# Patient Record
Sex: Male | Born: 1995 | Race: Black or African American | Hispanic: No | Marital: Single | Smoking: Current every day smoker
Health system: Southern US, Community
[De-identification: ages and names within clinical notes are randomized; demographics above are authoritative.]

---
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
## 2014-01-25 ENCOUNTER — Encounter (HOSPITAL_COMMUNITY): Payer: Self-pay | Admitting: Emergency Medicine

## 2014-01-25 ENCOUNTER — Emergency Department (HOSPITAL_COMMUNITY)
Admission: EM | Admit: 2014-01-25 | Discharge: 2014-01-26 | Disposition: A | Discharge status: Home / Self Care | Payer: Medicaid Other | Attending: Emergency Medicine | Admitting: Emergency Medicine

## 2014-01-25 DIAGNOSIS — S61512A Laceration without foreign body of left wrist, initial encounter: Secondary | ICD-10-CM | POA: Insufficient documentation

## 2014-01-25 DIAGNOSIS — Y280XXA Contact with sharp glass, undetermined intent, initial encounter: Secondary | ICD-10-CM | POA: Insufficient documentation

## 2014-01-25 DIAGNOSIS — R000 Tachycardia, unspecified: Secondary | ICD-10-CM | POA: Insufficient documentation

## 2014-01-25 DIAGNOSIS — Y9389 Activity, other specified: Secondary | ICD-10-CM | POA: Diagnosis not present

## 2014-01-25 DIAGNOSIS — Y9289 Other specified places as the place of occurrence of the external cause: Secondary | ICD-10-CM | POA: Insufficient documentation

## 2014-01-25 DIAGNOSIS — Z23 Encounter for immunization: Secondary | ICD-10-CM | POA: Diagnosis not present

## 2014-01-25 MED ORDER — TETANUS-DIPHTH-ACELL PERTUSSIS 5-2.5-18.5 LF-MCG/0.5 IM SUSP
0.5000 mL | Freq: Once | INTRAMUSCULAR | Status: AC
  Administered 2014-01-25: 0.5 mL via INTRAMUSCULAR
  Filled 2014-01-25: qty 0.5

## 2014-01-25 MED ORDER — LIDOCAINE-EPINEPHRINE (PF) 2 %-1:200000 IJ SOLN
10.0000 mL | Freq: Once | INTRAMUSCULAR | Status: AC
  Administered 2014-01-25: 10 mL
  Filled 2014-01-25: qty 20

## 2014-01-25 MED ORDER — HYDROCODONE-ACETAMINOPHEN 5-325 MG PO TABS
1.0000 | ORAL_TABLET | Freq: Once | ORAL | Status: AC
  Administered 2014-01-25: 1 via ORAL
  Filled 2014-01-25: qty 1

## 2014-01-25 NOTE — ED Provider Notes (Signed)
Medical screening examination/treatment/procedure(s) were conducted as a shared visit with non-physician practitioner(s) and myself.  I personally evaluated the patient during the encounter.   EKG Interpretation None      Patient seen by me. Patient with a cut to the posterior aspect of the left wrist. Was cut on glass. Wound is approximately 2 cm in size somewhat elliptical. No evidence of any deep structures. No evidence of any retained foreign body. Will be suture repaired. Neurovascular intact distally.  Vanetta MuldersScott Allye Hoyos, MD 01/25/14 2351

## 2014-01-25 NOTE — ED Notes (Signed)
Cut to the top side of the left forearm

## 2014-01-25 NOTE — ED Provider Notes (Signed)
CSN: [id number]     Arrival date & time 01/25/14  2304 History   First MD Initiated Contact with Patient 01/25/14 2315     Chief Complaint  Patient presents with  . Laceration     (Consider location/radiation/quality/duration/timing/severity/associated sxs/prior Treatment) HPI Comments: Joseph BernheimJamil Strickland is a 18 y.o. male with no significant PMHx, who presents to the ED today with complaints of laceration to the L wrist/forearm over the dorsal aspect, which occurred on the edge of a piece of fiberglass 20mins ago. He reports he was reaching for something and the fiberglass cut him. Bleeding was controlled immediately. Pain is 6/10 constant aching nonradiating worse with movement of the skin, and without known alleviating factors as pt didn't try anything prior to arrival. Denies any numbness, tingling, weakness, loss of ROM, color changes, visible FBs, hand/wrist/elbow pain, known bleeding disorders or currently on any blood thinning medications. Mother is with him and reports that his tetanus is out of date. Denies any medical conditions or allergies. Pt is right handed. PCP [facility].  Patient is a 18 y.o. male presenting with skin laceration. The history is provided by the patient. No language interpreter was used.  Laceration Location:  Shoulder/arm Shoulder/arm laceration location:  L wrist Length (cm):  2 Depth:  Through dermis Quality: straight   Bleeding: controlled   Injury mechanism: fiberglass in car. Pain details:    Quality:  Aching   Severity:  Moderate (6/10)   Timing:  Constant   Progression:  Unchanged Foreign body present:  No foreign bodies Relieved by:  None tried Worsened by:  Movement Ineffective treatments:  None tried Tetanus status:  Out of date   History reviewed. No pertinent past medical history. History reviewed. No pertinent past surgical history. History reviewed. No pertinent family history. History  Substance Use Topics  . Smoking status:  Never Smoker   . Smokeless tobacco: Never Used  . Alcohol Use: No    Review of Systems  Musculoskeletal: Negative for arthralgias, joint swelling and myalgias.  Skin: Positive for wound. Negative for color change.  Allergic/Immunologic: Negative for immunocompromised state.  Neurological: Negative for dizziness, weakness, light-headedness and numbness.  Hematological: Does not bruise/bleed easily.   10 Systems reviewed and are negative for acute change except as noted in the HPI.    Allergies  Review of patient's allergies indicates no known allergies.  Home Medications   Prior to Admission medications   Not on File   BP 128/71  Pulse 116  Temp(Src) 98.9 F (37.2 C) (Oral)  Resp 20  Ht 5\' 8"  (1.727 m)  Wt 158 lb (71.668 kg)  BMI 24.03 kg/m2  SpO2 99% Physical Exam  Nursing note and vitals reviewed. Constitutional: He is oriented to person, place, and time. He appears well-developed and well-nourished.  Non-toxic appearance. No distress.  Afebrile, nontoxic, NAD, very pleasant  HENT:  Head: Normocephalic and atraumatic.  Mouth/Throat: Mucous membranes are normal.  Eyes: Conjunctivae and EOM are normal. Right eye exhibits no discharge. Left eye exhibits no discharge.  Neck: Normal range of motion. Neck supple.  Cardiovascular: Intact distal pulses.  Tachycardia present.   Tachycardia noted initially, which resolved Distal pulses intact, cap refill brisk and present in all digits  Pulmonary/Chest: Effort normal. No respiratory distress.  Abdominal: Normal appearance. He exhibits no distension.  Musculoskeletal: Normal range of motion.       Left wrist: He exhibits laceration. He exhibits normal range of motion, no tenderness, no swelling, no effusion and no crepitus.  Arms:      Left hand: Normal. Normal sensation noted. Normal strength noted.  FROM intact at L wrist and in all digits, no bony TTP or deformity, no crepitus. 2cm laceration located just proximal to  L wrist on the dorsal surface, wound edges separated by approx 0.5cm but approximate well, bleeding controlled. Superficial and no underlying tissues exposed or damaged. Flexor and extensor tendons intact and not visible through wound. Sensation grossly intact in all digits and extremities. Strength 5/5 in all extremities. Cap refill brisk and present, distal pulses equal, soft compartments  Neurological: He is alert and oriented to person, place, and time. He has normal strength. No sensory deficit.  Skin: Skin is warm and dry. Laceration noted. No rash noted.  L wrist laceration as noted above  Psychiatric: He has a normal mood and affect.      ED Course  LACERATION REPAIR Date/Time: 01/26/2014 11:47 PM Performed by: Marjean DonnaAMPRUBI-SOMS, Ardice Boyan STRUPP Authorized by: Ramond MarrowAMPRUBI-SOMS, Hanae Waiters STRUPP Consent: Verbal consent obtained. Risks and benefits: risks, benefits and alternatives were discussed Consent given by: patient and parent Patient understanding: patient states understanding of the procedure being performed Patient consent: the patient's understanding of the procedure matches consent given Patient identity confirmed: verbally with patient Body area: upper extremity Location details: left wrist Laceration length: 2 cm Foreign bodies: no foreign bodies Tendon involvement: none Nerve involvement: none Vascular damage: no Anesthesia: local infiltration Local anesthetic: lidocaine 2% with epinephrine Anesthetic total: 2 ml Patient sedated: no Preparation: Patient was prepped and draped in the usual sterile fashion. Irrigation solution: saline Irrigation method: syringe Amount of cleaning: extensive Debridement: none Degree of undermining: none Skin closure: 4-0 Prolene Number of sutures: 3 Technique: simple Approximation: close Approximation difficulty: simple Dressing: 4x4 sterile gauze, antibiotic ointment and splint Patient tolerance: Patient tolerated the procedure  well with no immediate complications.   (including critical care time) Labs Review Labs Reviewed - No data to display  Imaging Review No results found.   EKG Interpretation None      MDM   Final diagnoses:  Laceration of wrist, left, initial encounter  Tetanus toxoid vaccination administered at current visit    18y/o male with superficial lac to L forearm. Neurovascularly intact with FROM and strength preserved, no bony TTP. No visible FB, and wound occurred on a nonradioopaque object therefore doubt xray is needed or that it would help. Will update tetanus and give pain meds and repair laceration with sutures. Lac repaired with 3 sutures, well approximated. Will place in splint for protection. Applied bacitracin ointment and dressing. Doubt need for empiric antibiotics, pt is healthy with no comorbidities, wound was irrigated with copious amounts of saline. Will have him f/up in 2 days at PCP for wound recheck and eval need for abx at that time. Discussed sutured lac care instructions including ice/elevation, wound care, and pain meds. Rx for pain meds given. Discussed suture removal in 7-10 days. As for tachycardia, likely related to pain. Resolved during ED course. I explained the diagnosis and have given explicit precautions to return to the ER including for any other new or worsening symptoms. The patient understands and accepts the medical plan as it's been dictated and I have answered their questions. Discharge instructions concerning home care and prescriptions have been given. The patient is STABLE and is discharged to home in good condition.  BP 125/69  Pulse 100  Temp(Src) 98.5 F (36.9 C) (Oral)  Resp 18  Ht 5\' 8"  (1.727 m)  Wt 158  lb (71.668 kg)  BMI 24.03 kg/m2  SpO2 98%  Meds ordered this encounter  Medications  . Tdap (BOOSTRIX) injection 0.5 mL    Sig:   . HYDROcodone-acetaminophen (NORCO/VICODIN) 5-325 MG per tablet 1 tablet    Sig:   . lidocaine-EPINEPHrine  (XYLOCAINE W/EPI) 2 %-1:200000 (PF) injection 10 mL    Sig:   . naproxen (NAPROSYN) 500 MG tablet    Sig: Take 1 tablet (500 mg total) by mouth 2 (two) times daily as needed for mild pain, moderate pain or headache (TAKE WITH MEALS.).    Dispense:  20 tablet    Refill:  0    Order Specific Question:  Supervising Provider    Answer:  Eber Hong D [[id number]]  . HYDROcodone-acetaminophen (NORCO) 5-325 MG per tablet    Sig: Take 1-2 tablets by mouth every 6 (six) hours as needed for severe pain.    Dispense:  6 tablet    Refill:  0    Order Specific Question:  Supervising Provider    Answer:  Eber Hong D [[id number]]  . bacitracin ointment    Sig: Apply 1 application topically 2 (two) times daily.    Dispense:  120 g    Refill:  0    Order Specific Question:  Supervising Provider    Answer:  Vida Roller [address] Camprubi-Soms, PA-C 01/26/14 [phone]

## 2014-01-26 MED ORDER — NAPROXEN 500 MG PO TABS: 500.0000 mg | ORAL_TABLET | Freq: Two times a day (BID) | ORAL | Status: AC | PRN

## 2014-01-26 MED ORDER — BACITRACIN ZINC 500 UNIT/GM EX OINT: 1.0000 "application " | TOPICAL_OINTMENT | Freq: Two times a day (BID) | CUTANEOUS | Status: AC

## 2014-01-26 MED ORDER — HYDROCODONE-ACETAMINOPHEN 5-325 MG PO TABS: 1.0000 | ORAL_TABLET | Freq: Four times a day (QID) | ORAL | Status: AC | PRN

## 2014-01-26 NOTE — Discharge Instructions (Signed)
Keep wound and clean with mild soap and water. Keep area covered with a topical antibiotic ointment and bandage, keep bandage dry, and do not submerge in water for 24 hours. Ice and elevate for additional pain relief and swelling. Alternate between naprosyn and norco for additional pain relief but don't drive or operate machinery while taking norco. Use wrist splint to protect the stitches while they heal. [facility] your primary care doctor or [facility] in 2 days for wound recheck and in 7-10 days for suture removal. Monitor area for signs of infection to include, but not limited to: increasing pain, redness, drainage/pus, or swelling. Return to emergency department for emergent changing or worsening symptoms.   Laceration Care, Adult A laceration is a cut that goes through all layers of the skin. The cut goes into the tissue beneath the skin. HOME CARE For stitches (sutures) or staples:  Keep the cut clean and dry.  If you have a bandage (dressing), change it at least once a day. Change the bandage if it gets wet or dirty, or as told by your doctor.  Wash the cut with soap and water 2 times a day. Rinse the cut with water. Pat it dry with a clean towel.  Put a thin layer of medicated cream on the cut as told by your doctor.  You may shower after the first 24 hours. Do not soak the cut in water until the stitches are removed.  Only take medicines as told by your doctor.  Have your stitches or staples removed as told by your doctor. For skin adhesive strips:  Keep the cut clean and dry.  Do not get the strips wet. You may take a bath, but be careful to keep the cut dry.  If the cut gets wet, pat it dry with a clean towel.  The strips will fall off on their own. Do not remove the strips that are still stuck to the cut. For wound glue:  You may shower or take baths. Do not soak or scrub the cut. Do not swim. Avoid heavy sweating until the glue falls off on  its own. After a shower or bath, pat the cut dry with a clean towel.  Do not put medicine on your cut until the glue falls off.  If you have a bandage, do not put tape over the glue.  Avoid lots of sunlight or tanning lamps until the glue falls off. Put sunscreen on the cut for the first year to reduce your scar.  The glue will fall off on its own. Do not pick at the glue. You may need a tetanus shot if:  You cannot remember when you had your last tetanus shot.  You have never had a tetanus shot. If you need a tetanus shot and you choose not to have one, you may get tetanus. Sickness from tetanus can be serious. GET HELP RIGHT AWAY IF:   Your pain does not get better with medicine.  Your arm, hand, leg, or foot loses feeling (numbness) or changes color.  Your cut is bleeding.  Your joint feels weak, or you cannot use your joint.  You have painful lumps on your body.  Your cut is red, puffy (swollen), or painful.  You have a red line on the skin near the cut.  You have yellowish-white fluid (pus) coming from the cut.  You have a fever.  You have a bad smell coming from the cut or  bandage.  Your cut breaks open before or after stitches are removed.  You notice something coming out of the cut, such as wood or glass.  You cannot move a finger or toe. MAKE SURE YOU:   Understand these instructions.  Will watch your condition.  Will get help right away if you are not doing well or get worse. Document Released: 09/10/2007 Document Revised: 06/16/2011 Document Reviewed: 09/17/2010 [facility] Patient Information 2015 [place], [place]. This information is not intended to replace advice given to you by your health care provider. Make sure you discuss any questions you have with your health care provider.  Sutured Wound Care Sutures are stitches that can be used to close wounds. Caring for your wound can help stop infection and lessen pain. HOME CARE   Rest and raise  (elevate) the injured area until the pain and puffiness (swelling) go away.  Only take medicines as told by your doctor.  Clean the wound gently with mild soap and water once a day after the first 2 days. Rinse off the soap. Pat the area dry with a clean towel. Do not rub the wound.  Change the bandage (dressing) as told by your doctor. If the bandage sticks, soak it off with soapy water. Stop using a bandage after 2 days or after the wound stops leaking fluid.  Put cream on the wound as told by your doctor.  Do not stretch the wound.  Drink enough fluids to keep your pee (urine) clear or pale yellow.  See your doctor to have the sutures removed.  Use sunscreen or sunblock on the wound after it heals. GET HELP RIGHT AWAY IF:   Your wound gets red, puffy, hot, or tender.  You have more pain in the wound.  You have a red streak that goes away from the wound.  You see yellowish-white fluid (pus) coming out of the wound.  You have a fever.  You have chills and start to shake.  You notice a bad smell coming from the wound.  Your wound will not stop bleeding. MAKE SURE YOU:   Understand these instructions.  Will watch your condition.  Will get help right away if you are not doing well or get worse. Document Released: 09/10/2007 Document Revised: 06/16/2011 Document Reviewed: 07/28/2010 [facility] Patient Information 2015 [place], [place]. This information is not intended to replace advice given to you by your health care provider. Make sure you discuss any questions you have with your health care provider.

## 2021-01-04 ENCOUNTER — Ambulatory Visit (HOSPITAL_COMMUNITY)
Admission: EM | Admit: 2021-01-04 | Discharge: 2021-01-04 | Disposition: A | Discharge status: Home / Self Care | Payer: Self-pay | Attending: Emergency Medicine | Admitting: Emergency Medicine

## 2021-01-04 ENCOUNTER — Encounter (HOSPITAL_COMMUNITY): Payer: Self-pay | Admitting: Emergency Medicine

## 2021-01-04 ENCOUNTER — Other Ambulatory Visit: Payer: Self-pay

## 2021-01-04 DIAGNOSIS — Z202 Contact with and (suspected) exposure to infections with a predominantly sexual mode of transmission: Secondary | ICD-10-CM | POA: Insufficient documentation

## 2021-01-04 NOTE — ED Provider Notes (Signed)
[facility]    CSN: [id number] Arrival date & time: 01/04/21  9417      History   Chief Complaint Chief Complaint  Patient presents with   Exposure to STD    HPI Joseph Strickland is a 25 y.o. male.   Patient here for evaluation after possible exposure to STD.  Reports one of his partners told him she was evaluated here several days ago but did not tell him if she was actually positive for anything.  Denies any penile discharge or pain.  Denies any rashes.  Denies any dysuria, urgency, or frequency.  Denies any history of STDs.  Denies any trauma, injury, or other precipitating event.  Denies any specific alleviating or aggravating factors.  Denies any fevers, chest pain, shortness of breath, N/V/D, numbness, tingling, weakness, abdominal pain, or headaches.    The history is provided by the patient.  Exposure to STD   No past medical history on file.  There are no problems to display for this patient.   History reviewed. No pertinent surgical history.     Home Medications    Prior to Admission medications   Medication Sig Start Date End Date Taking? Authorizing Provider  bacitracin ointment Apply 1 application topically 2 (two) times daily. 01/26/14   Street, [place], PA-C  HYDROcodone-acetaminophen (NORCO) 5-325 MG per tablet Take 1-2 tablets by mouth every 6 (six) hours as needed for severe pain. 01/26/14   Street, [place], PA-C  naproxen (NAPROSYN) 500 MG tablet Take 1 tablet (500 mg total) by mouth 2 (two) times daily as needed for mild pain, moderate pain or headache (TAKE WITH MEALS.). 01/26/14   Street, [place], PA-C    Family History No family history on file.  Social History Social History   Tobacco Use   Smoking status: Never   Smokeless tobacco: Never  Substance Use Topics   Alcohol use: No   Drug use: Yes     Allergies   Patient has no known allergies.   Review of Systems Review of Systems  Genitourinary:  Negative for dysuria,  frequency, hematuria, penile pain and urgency.  All other systems reviewed and are negative.   Physical Exam Triage Vital Signs ED Triage Vitals  Enc Vitals Group     BP 01/04/21 0850 123/81     Pulse Rate 01/04/21 0850 73     Resp 01/04/21 0850 15     Temp 01/04/21 0850 99 F (37.2 C)     Temp Source 01/04/21 0850 Oral     SpO2 01/04/21 0850 99 %     Weight --      Height --      Head Circumference --      Peak Flow --      Pain Score 01/04/21 0849 0     Pain Loc --      Pain Edu? --      Excl. in GC? --    No data found.  Updated Vital Signs BP 123/81 (BP Location: Left Arm)   Pulse 73   Temp 99 F (37.2 C) (Oral)   Resp 15   SpO2 99%   Visual Acuity Right Eye Distance:   Left Eye Distance:   Bilateral Distance:    Right Eye Near:   Left Eye Near:    Bilateral Near:     Physical Exam Vitals and nursing note reviewed.  Constitutional:      General: He is not in acute distress.    Appearance: Normal  appearance. He is not ill-appearing, toxic-appearing or diaphoretic.  HENT:     Head: Normocephalic and atraumatic.  Eyes:     Conjunctiva/sclera: Conjunctivae normal.  Cardiovascular:     Rate and Rhythm: Normal rate.     Pulses: Normal pulses.  Pulmonary:     Effort: Pulmonary effort is normal.  Abdominal:     General: Abdomen is flat.  Genitourinary:    Comments: declines Musculoskeletal:        General: Normal range of motion.     Cervical back: Normal range of motion.  Skin:    General: Skin is warm and dry.  Neurological:     General: No focal deficit present.     Mental Status: He is alert and oriented to person, place, and time.  Psychiatric:        Mood and Affect: Mood normal.     UC Treatments / Results  Labs (all labs ordered are listed, but only abnormal results are displayed) Labs Reviewed  CYTOLOGY, (ORAL, ANAL, URETHRAL) ANCILLARY ONLY    EKG   Radiology No results found.  Procedures Procedures (including critical care  time)  Medications Ordered in UC Medications - No data to display  Initial Impression / Assessment and Plan / UC Course  I have reviewed the triage vital signs and the nursing notes.  Pertinent labs & imaging results that were available during my care of the patient were reviewed by me and considered in my medical decision making (see chart for details).    Assessment negative for red flags or concerns.  Self swab obtained and will treat based on results.  Discussed safe sex practices including condoms or other barrier method use.  Instructed to abstain from sex while waiting on test results.  Follow-up as needed Final Clinical Impressions(s) / UC Diagnoses   Final diagnoses:  Possible exposure to STD     Discharge Instructions      We will contact you if the results from your lab work are positive and require additional treatment.    Do not have sex while taking undergoing treatment for STI.  Make sure that all of your partners get tested and treated.   Use a condom or other barrier method for all sexual encounters.    Return or go to the Emergency Department if symptoms worsen or do not improve in the next few days.      ED Prescriptions   None    PDMP not reviewed this encounter.   Ivette Loyal, NP 01/04/21 [phone]

## 2021-01-04 NOTE — ED Triage Notes (Signed)
Pt reports that some one told him that they needed to get tested for STDs. Asked if it was a partner and if they had tested positive for something particular. Pt unsure. Denies any discharge, drainage, rashes, or issues with urination.

## 2021-01-04 NOTE — Discharge Instructions (Signed)
We will contact you if the results from your lab work are positive and require additional treatment.    Do not have sex while taking undergoing treatment for STI.  Make sure that all of your partners get tested and treated.   Use a condom or other barrier method for all sexual encounters.    Return or go to the Emergency Department if symptoms worsen or do not improve in the next few days.  

## 2021-01-07 ENCOUNTER — Telehealth (HOSPITAL_COMMUNITY): Payer: Self-pay | Admitting: Emergency Medicine

## 2021-01-07 LAB — CYTOLOGY, (ORAL, ANAL, URETHRAL) ANCILLARY ONLY
Chlamydia: NEGATIVE
Comment: NEGATIVE
Comment: NEGATIVE
Comment: NORMAL
Neisseria Gonorrhea: POSITIVE — AB
Trichomonas: POSITIVE — AB

## 2021-01-07 MED ORDER — METRONIDAZOLE 500 MG PO TABS: 2000.0000 mg | ORAL_TABLET | Freq: Once | ORAL | 0 refills | Status: AC

## 2021-01-07 NOTE — Telephone Encounter (Signed)
Per protocol, patient will need treatment with IM Rocephin 500mg  for positive Gonorrhea, and Metronidazole for positive Trichomonas.   Contacted patient by phone.  Verified identity using two identifiers.  Provided positive result.  Reviewed safe sex practices, notifying partners, and refraining from sexual activities for 7 days from time of treatment.  Patient verified understanding, all questions answered.   Verified pharmacy, prescription sent [facility] notified

## 2021-01-08 ENCOUNTER — Ambulatory Visit (HOSPITAL_COMMUNITY)
Admission: RE | Admit: 2021-01-08 | Discharge: 2021-01-08 | Disposition: A | Discharge status: Home / Self Care | Payer: Medicaid Other | Source: Ambulatory Visit | Attending: Family Medicine | Admitting: Family Medicine

## 2021-01-08 ENCOUNTER — Other Ambulatory Visit: Payer: Self-pay

## 2021-01-08 DIAGNOSIS — A549 Gonococcal infection, unspecified: Secondary | ICD-10-CM

## 2021-01-08 MED ORDER — CEFTRIAXONE SODIUM 500 MG IJ SOLR
INTRAMUSCULAR | Status: AC
  Filled 2021-01-08: qty 500

## 2021-01-08 MED ORDER — CEFTRIAXONE SODIUM 500 MG IJ SOLR
500.0000 mg | Freq: Once | INTRAMUSCULAR | Status: AC
  Administered 2021-01-08: 500 mg via INTRAMUSCULAR

## 2021-01-08 NOTE — ED Triage Notes (Signed)
Pt presents for STD treatment. Denies concerns.

## 2021-02-26 ENCOUNTER — Other Ambulatory Visit: Payer: Self-pay

## 2021-02-26 ENCOUNTER — Ambulatory Visit: Payer: Medicaid Other | Attending: Family Medicine | Admitting: Family Medicine

## 2021-02-26 ENCOUNTER — Encounter: Payer: Self-pay | Admitting: Family Medicine

## 2021-02-26 VITALS — BP 124/80 | HR 74 | Ht 68.0 in | Wt 198.4 lb

## 2021-02-26 DIAGNOSIS — B351 Tinea unguium: Secondary | ICD-10-CM

## 2021-02-26 DIAGNOSIS — B353 Tinea pedis: Secondary | ICD-10-CM

## 2021-02-26 DIAGNOSIS — Z1159 Encounter for screening for other viral diseases: Secondary | ICD-10-CM

## 2021-02-26 MED ORDER — TERBINAFINE HCL 1 % EX CREA: 1.0000 "application " | TOPICAL_CREAM | Freq: Two times a day (BID) | CUTANEOUS | 1 refills | Status: AC

## 2021-02-26 NOTE — Patient Instructions (Signed)

## 2021-02-26 NOTE — Progress Notes (Signed)
Subjective:  Patient ID: Joseph Strickland, male    DOB: 11-04-1995  Age: 25 y.o. MRN: [id number]  CC: New Patient (Initial Visit)   HPI Joseph Strickland is a 25 y.o. year old male who presents today to establish care.  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 Sometimes he has pain in his big toenails bilaterally but denies presence of erythema. He dislikes the dark discoloration of his big toenails.  No past medical history on file.  No past surgical history on file.  Family History  Family history unknown: Yes    No Known Allergies  Outpatient Medications Prior to Visit  Medication Sig Dispense Refill   bacitracin ointment Apply 1 application topically 2 (two) times daily. 120 g 0   HYDROcodone-acetaminophen (NORCO) 5-325 MG per tablet Take 1-2 tablets by mouth every 6 (six) hours as needed for severe pain. 6 tablet 0   naproxen (NAPROSYN) 500 MG tablet Take 1 tablet (500 mg total) by mouth 2 (two) times daily as needed for mild pain, moderate pain or headache (TAKE WITH MEALS.). 20 tablet 0   No facility-administered medications prior to visit.     ROS Review of Systems  Constitutional:  Negative for activity change and appetite change.  HENT:  Negative for sinus pressure and sore throat.   Eyes:  Negative for visual disturbance.  Respiratory:  Negative for cough, chest tightness and shortness of breath.   Cardiovascular:  Negative for chest pain and leg swelling.  Gastrointestinal:  Negative for abdominal distention, abdominal pain, constipation and diarrhea.  Endocrine: Negative.   Genitourinary:  Negative for dysuria.  Musculoskeletal:  Negative for joint swelling and myalgias.  Skin:  Negative for rash.  Allergic/Immunologic: Negative.   Neurological:  Negative for weakness, light-headedness and numbness.   Psychiatric/Behavioral:  Negative for dysphoric mood and suicidal ideas.    Objective:  BP 124/80   Pulse 74   Ht _0  (1.727 m)   Wt 198 lb 6.4 oz (90 kg)   SpO2 99%   BMI 30.17 kg/m   BP/Weight 02/26/2021 01/04/2021 47/65/4650  Systolic BP 354 656 812  Diastolic BP 80 81 69  Wt. (Lbs) 198.4 - 158  BMI 30.17 - 24.03      Physical Exam Constitutional:      Appearance: He is well-developed.  Cardiovascular:     Rate and Rhythm: Normal rate.     Heart sounds: Normal heart sounds. No murmur heard. Pulmonary:     Effort: Pulmonary effort is normal.     Breath sounds: Normal breath sounds. No wheezing or rales.  Chest:     Chest wall: No tenderness.  Abdominal:     General: Bowel sounds are normal. There is no distension.     Palpations: Abdomen is soft. There is no mass.     Tenderness: There is no abdominal tenderness.  Musculoskeletal:        General: Normal range of motion.     Right lower leg: No edema.     Left lower leg: No edema.  Skin:    Comments: Blackish discoloration of bilateral great toes with associated thickening of nail plate, second toes of both feet with early blackish discoloration.  Calluses on medial aspect of great toe bilaterally.  No tenderness to palpation noted. Tinea pedis in third and fourth webspaces  of both feet  Neurological:     Mental Status: He is alert and oriented to person, place, and time.  Psychiatric:        Mood and Affect: Mood normal.    Assessment & Plan:  1. Tinea pedis of both feet Foot care discussed Advised to allow aeration of webspaces when at home - Ambulatory referral to Podiatry - terbinafine (LAMISIL AT) 1 % cream; Apply 1 application topically 2 (two) times daily.  Dispense: 30 g; Refill: 1 - CMP14+EGFR  2. Screening for viral disease - HCV Ab w Reflex to Quant PCR - HIV Antibody (routine testing w rflx)  3. Onychomycosis He is also concerned about the blackish discoloration of his great toe Will refer  to podiatry - Ambulatory referral to [facility] ordered this encounter  Medications   terbinafine (LAMISIL AT) 1 % cream    Sig: Apply 1 application topically 2 (two) times daily.    Dispense:  30 g    Refill:  1    Follow-up: Return if symptoms worsen or fail to improve.       Charlott Rakes, MD, FAAFP. [facility] [place], [place]   02/26/2021, 9:54 AM

## 2021-02-26 NOTE — Progress Notes (Signed)
Concern about his left and right big toe. Mainly left.

## 2021-02-27 LAB — CMP14+EGFR
ALT: 17 IU/L (ref 0–44)
AST: 15 IU/L (ref 0–40)
Albumin/Globulin Ratio: 1.7 (ref 1.2–2.2)
Albumin: 4.7 g/dL (ref 4.1–5.2)
Alkaline Phosphatase: 70 IU/L (ref 44–121)
BUN/Creatinine Ratio: 9 (ref 9–20)
BUN: 9 mg/dL (ref 6–20)
Bilirubin Total: 0.4 mg/dL (ref 0.0–1.2)
CO2: 26 mmol/L (ref 20–29)
Calcium: 9.8 mg/dL (ref 8.7–10.2)
Chloride: 102 mmol/L (ref 96–106)
Creatinine, Ser: 1.04 mg/dL (ref 0.76–1.27)
Globulin, Total: 2.7 g/dL (ref 1.5–4.5)
Glucose: 98 mg/dL (ref 70–99)
Potassium: 4.6 mmol/L (ref 3.5–5.2)
Sodium: 139 mmol/L (ref 134–144)
Total Protein: 7.4 g/dL (ref 6.0–8.5)
eGFR: 102 mL/min/{1.73_m2} (ref >59)

## 2021-02-27 LAB — HCV AB W REFLEX TO QUANT PCR: HCV Ab: 0.4 s/co ratio (ref 0.0–0.9)

## 2021-02-27 LAB — HCV INTERPRETATION

## 2021-02-27 LAB — HIV ANTIBODY (ROUTINE TESTING W REFLEX): HIV Screen 4th Generation wRfx: NONREACTIVE

## 2021-03-04 ENCOUNTER — Ambulatory Visit: Payer: Self-pay | Admitting: Podiatry

## 2021-05-23 ENCOUNTER — Emergency Department (HOSPITAL_COMMUNITY)
Admission: EM | Admit: 2021-05-23 | Discharge: 2021-05-23 | Disposition: A | Discharge status: Home / Self Care | Payer: Medicaid Other | Attending: Emergency Medicine | Admitting: Emergency Medicine

## 2021-05-23 DIAGNOSIS — U071 COVID-19: Secondary | ICD-10-CM | POA: Insufficient documentation

## 2021-05-23 LAB — RESP PANEL BY RT-PCR (FLU A&B, COVID) ARPGX2
Influenza A by PCR: NEGATIVE
Influenza B by PCR: NEGATIVE
SARS Coronavirus 2 by RT PCR: POSITIVE — AB

## 2021-05-23 MED ORDER — BENZONATATE 100 MG PO CAPS: 100.0000 mg | ORAL_CAPSULE | Freq: Three times a day (TID) | ORAL | 0 refills | Status: AC

## 2021-05-23 MED ORDER — LIDOCAINE VISCOUS HCL 2 % MT SOLN: 15.0000 mL | Freq: Four times a day (QID) | OROMUCOSAL | 0 refills | Status: AC | PRN

## 2021-05-23 NOTE — Discharge Instructions (Addendum)
You came to the emergency department today with reports of Covid-19 like symptoms.  These symptoms may be due to Covid 19 or another viral illness.  You have a COVID test pending. Please isolate at home while awaiting your results.  > If your test is negative, stay home until your fever has resolved/your symptoms are improving. > If your test is positive, isolate at home for at least 7 days after the day your symptoms initially began, and THEN at least 24 hours after you are fever-free without the help of medications (Tylenol/acetaminophen and Advil/ibuprofen/Motrin) AND your symptoms are improving.  You can alternate Tylenol/acetaminophen and Advil/ibuprofen/Motrin every 4 hours for sore throat, body aches, headache or fever.  Drink plenty of water.  Use saline nasal spray for congestion. You can take Tessalon every 8 hours as needed for cough. Wash your hands frequently. Please rest as needed with frequent repositioning and ambulation as tolerated.    If you use a CPAP or BiPAP device for management of obstructive sleep apnea may continue to use it however use it when isolated from other individuals to avoid spread of COVID-19.   If you use a nebulizer administer medication such as albuterol you may continue to use it however only one isolated from other individuals to avoid the spread of COVID-19.  If your symptoms do not improve please follow-up with your primary care provider or urgent care.  Return to the ER for significant shortness of breath, uncontrollable vomiting, severe chest pain, inability to tolerate fluids, changes in mental status such as confusion or other concerning symptoms.  

## 2021-05-23 NOTE — ED Provider Notes (Signed)
[place] EMERGENCY DEPARTMENT Provider Note   CSN: [id number] Arrival date & time: 05/23/21  1050     History  Chief Complaint  Patient presents with   Covid Positive   Generalized Body Aches   Sore Throat   Headache   Shortness of Breath    Joseph Strickland is a 26 y.o. male with no pertinent past medical history.  Presents to the emergency department with a chief complaint of flulike illness.  Patient endorses generalized headaches, body aches, rhinorrhea, nasal congestion, sore throat and nonproductive cough.  Symptoms started on Monday.  Patient reports that he tested positive for COVID-19 on a home test yesterday.  Patient reports being vaccinated for COVID-19 however has not received any boosters.  Patient has not tried any over-the-counter medications to help with his symptoms.  Patient denies any fever, chills, trouble swallowing, drooling, high potato voice, neck pain, neck stiffness, abdominal pain, nausea, vomiting, diarrhea, shortness of breath, lightheadedness, syncope.   Sore Throat Associated symptoms include headaches. Pertinent negatives include no chest pain, no abdominal pain and no shortness of breath.  Headache Associated symptoms: congestion, cough, drainage, myalgias (Generalized) and sore throat   Associated symptoms: no abdominal pain, no back pain, no diarrhea, no dizziness, no fever, no nausea, no neck pain, no neck stiffness and no vomiting   Shortness of Breath Associated symptoms: cough, headaches and sore throat   Associated symptoms: no abdominal pain, no chest pain, no fever, no neck pain, no rash and no vomiting       Home Medications Prior to Admission medications   Medication Sig Start Date End Date Taking? Authorizing Provider  bacitracin ointment Apply 1 application topically 2 (two) times daily. 01/26/14   Street, [place], PA-C  HYDROcodone-acetaminophen (NORCO) 5-325 MG per tablet Take 1-2 tablets by mouth every 6 (six)  hours as needed for severe pain. 01/26/14   Street, [place], PA-C  naproxen (NAPROSYN) 500 MG tablet Take 1 tablet (500 mg total) by mouth 2 (two) times daily as needed for mild pain, moderate pain or headache (TAKE WITH MEALS.). 01/26/14   Street, [place], PA-C  terbinafine (LAMISIL AT) 1 % cream Apply 1 application topically 2 (two) times daily. 02/26/21   Charlott Rakes, MD      Allergies    Patient has no known allergies.    Review of Systems   Review of Systems  Constitutional:  Negative for chills and fever.  HENT:  Positive for congestion, postnasal drip, rhinorrhea and sore throat. Negative for drooling and trouble swallowing.   Eyes:  Negative for visual disturbance.  Respiratory:  Positive for cough. Negative for shortness of breath.   Cardiovascular:  Negative for chest pain.  Gastrointestinal:  Negative for abdominal pain, diarrhea, nausea and vomiting.  Genitourinary:  Negative for difficulty urinating and dysuria.  Musculoskeletal:  Positive for myalgias (Generalized). Negative for back pain, neck pain and neck stiffness.  Skin:  Negative for color change and rash.  Neurological:  Positive for headaches. Negative for dizziness, syncope and light-headedness.  Psychiatric/Behavioral:  Negative for confusion.    Physical Exam Updated Vital Signs BP 132/82 (BP Location: Right Arm)    Pulse 97    Temp 98.6 F (37 C) (Oral)    Resp 16    SpO2 100%  Physical Exam Vitals and nursing note reviewed.  Constitutional:      General: He is not in acute distress.    Appearance: He is not ill-appearing, toxic-appearing or diaphoretic.  HENT:  Head: Normocephalic.     Jaw: No trismus, swelling, pain on movement or malocclusion.     Mouth/Throat:     Lips: Pink. No lesions.     Mouth: Mucous membranes are moist.     Tongue: No lesions. Tongue does not deviate from midline.     Palate: No mass and lesions.     Pharynx: Oropharynx is clear. Uvula midline. No pharyngeal  swelling, oropharyngeal exudate, posterior oropharyngeal erythema or uvula swelling.     Tonsils: No tonsillar exudate or tonsillar abscesses. 1+ on the right. 1+ on the left.     Comments: Handles oral secretions without difficulty Eyes:     General: No scleral icterus.       Right eye: No discharge.        Left eye: No discharge.  Neck:     Comments: No swelling to submandibular space Cardiovascular:     Rate and Rhythm: Normal rate.  Pulmonary:     Effort: Pulmonary effort is normal. No tachypnea, bradypnea or respiratory distress.     Breath sounds: Normal breath sounds. No stridor.     Comments: Speaks in full complete sentences without difficulty Abdominal:     General: Abdomen is flat. There is no distension. There are no signs of injury.     Palpations: Abdomen is soft. There is no mass or pulsatile mass.     Tenderness: There is no abdominal tenderness.  Musculoskeletal:     Cervical back: Full passive range of motion without pain, normal range of motion and neck supple. No edema, erythema, signs of trauma, rigidity, torticollis or crepitus. No pain with movement. Normal range of motion.  Lymphadenopathy:     Cervical: No cervical adenopathy.  Skin:    General: Skin is warm and dry.  Neurological:     General: No focal deficit present.     Mental Status: He is alert and oriented to person, place, and time.     GCS: GCS eye subscore is 4. GCS verbal subscore is 5. GCS motor subscore is 6.     Comments: No facial symmetry or dysarthria.  Patient moves all limbs equally without difficulty.  Psychiatric:        Behavior: Behavior is cooperative.    ED Results / Procedures / Treatments   Labs (all labs ordered are listed, but only abnormal results are displayed) Labs Reviewed  RESP PANEL BY RT-PCR (FLU A&B, COVID) ARPGX2    EKG None  Radiology No results found.  Procedures Procedures    Medications Ordered in ED Medications - No data to display  ED Course/  Medical Decision Making/ A&P                           Medical Decision Making Risk Prescription drug management.   Alert 26 year old male in no acute distress, nontoxic-appearing.  Presents to the emergency department with a chief complaint of flulike illness.  Information was obtained from patient.  Past medical records were reviewed including previous provider notes.  Patient reports positive COVID-19 test in setting of flulike illness.  Lab testing was considered however patient does not meet criteria for antiviral therapy.  Patient has no signs of strep pharyngitis or peritonsillar abscess on exam.  Testing for strep pharyngitis was considered however ruled out via Centor score.  No signs of Ludwick's angina or other deep space neck infection.  Lungs clear to auscultation bilaterally.  Chest x-ray was considered however  low suspicion for pneumonia at this time.  We will swab patient for COVID-19 and influenza.  Suspect that patient will be positive with COVID-19 with positive at-home testing.  Plan to discharge patient with results pending.  Patient advised to follow results on his [facility] MyChart.  Discussed isolation precautions as well as symptomatic treatment with over-the-counter modalities.  Additionally will prescribe patient with Tessalon to help with his cough and viscous lidocaine to help with his sore throat.  Patient to follow-up with his primary care provider if symptoms do not improve.  Discussed results, findings, treatment and follow up. Patient advised of return precautions. Patient verbalized understanding and agreed with plan.  Joseph Strickland was evaluated in Emergency Department on 05/23/2021 for the symptoms described in the history of present illness. He was evaluated in the context of the global COVID-19 pandemic, which necessitated consideration that the patient might be at risk for infection with the SARS-CoV-2 virus that causes COVID-19. Institutional protocols  and algorithms that pertain to the evaluation of patients at risk for COVID-19 are in a state of rapid change based on information released by regulatory bodies including the CDC and federal and state organizations. These policies and algorithms were followed during the patient's care in the ED.         Final Clinical Impression(s) / ED Diagnoses Final diagnoses:  None    Rx / DC Orders ED Discharge Orders          Ordered    benzonatate (TESSALON) 100 MG capsule  Every 8 hours        05/23/21 1126    lidocaine (XYLOCAINE) 2 % solution  Every 6 hours PRN        05/23/21 [address] 05/23/21 2051    Varney Biles, MD 05/24/21 1324

## 2021-05-23 NOTE — ED Triage Notes (Signed)
Pt. Stated, I took a COVID test and it was positive and Im having all the symptom of COVId. Headache, SOB, body aches, and feeling bad since Monday.

## 2021-05-23 NOTE — ED Provider Triage Note (Signed)
Emergency Medicine Provider Triage Evaluation Note  Joseph Strickland , a 26 y.o. male  was evaluated in triage.  Pt complains of Presents to the emergency department with a chief complaint of flulike illness. States he tested positive for covid 19 on home test  Review of Systems  Positive: Body aches, sore throat, headache, cough Negative: Shob, fever chills  Physical Exam  BP 132/82 (BP Location: Right Arm)    Pulse 97    Temp 98.6 F (37 C) (Oral)    Resp 16    SpO2 100%  Gen:   Awake, no distress   Resp:  Normal effort  MSK:   Moves extremities without difficulty  Other:    Medical Decision Making  Medically screening exam initiated at 11:10 AM.  Appropriate orders placed.  Joseph Strickland was informed that the remainder of the evaluation will be completed by another provider, this initial triage assessment does not replace that evaluation, and the importance of remaining in the ED until their evaluation is complete.     Haskel Schroeder, [place] 05/25/21 1743

## 2021-06-12 ENCOUNTER — Ambulatory Visit: Payer: Self-pay | Admitting: Podiatry

## 2022-04-01 ENCOUNTER — Ambulatory Visit: Payer: Medicaid Other | Admitting: Podiatry

## 2022-04-11 ENCOUNTER — Ambulatory Visit (INDEPENDENT_AMBULATORY_CARE_PROVIDER_SITE_OTHER): Payer: Medicaid Other | Admitting: Podiatry

## 2022-04-11 ENCOUNTER — Other Ambulatory Visit: Payer: Self-pay | Admitting: Podiatry

## 2022-04-11 VITALS — BP 118/68

## 2022-04-11 DIAGNOSIS — B353 Tinea pedis: Secondary | ICD-10-CM

## 2022-04-11 DIAGNOSIS — B351 Tinea unguium: Secondary | ICD-10-CM | POA: Diagnosis not present

## 2022-04-11 DIAGNOSIS — Z79899 Other long term (current) drug therapy: Secondary | ICD-10-CM

## 2022-04-11 MED ORDER — CLOTRIMAZOLE-BETAMETHASONE 1-0.05 % EX CREA: 1.0000 | TOPICAL_CREAM | Freq: Every day | CUTANEOUS | 0 refills | Status: AC

## 2022-04-11 NOTE — Progress Notes (Signed)
Subjective:  Patient ID: Joseph Strickland, male    DOB: 22-Jul-1995,  MRN: [id number]  Chief Complaint  Patient presents with   Nail Problem    Nails are thick and discolored causing discomfort     27 y.o. male presents with the above complaint.  Bilateral hallux thickened elongated dystrophic toenails x 2 mild pain on palpation.  Patient tried some over-the-counter medication none of which has helped.  Would like to discuss other treatment options for nail fungus he has not seen anyone else prior to seeing me.   Review of Systems: Negative except as noted in the HPI. Denies N/V/F/Ch.  No past medical history on file.  Current Outpatient Medications:    clotrimazole-betamethasone (LOTRISONE) cream, Apply 1 Application topically daily., Disp: 30 g, Rfl: 0   bacitracin ointment, Apply 1 application topically 2 (two) times daily., Disp: 120 g, Rfl: 0   benzonatate (TESSALON) 100 MG capsule, Take 1 capsule (100 mg total) by mouth every 8 (eight) hours., Disp: 21 capsule, Rfl: 0   HYDROcodone-acetaminophen (NORCO) 5-325 MG per tablet, Take 1-2 tablets by mouth every 6 (six) hours as needed for severe pain., Disp: 6 tablet, Rfl: 0   lidocaine (XYLOCAINE) 2 % solution, Use as directed 15 mLs in the mouth or throat every 6 (six) hours as needed for mouth pain., Disp: 300 mL, Rfl: 0   naproxen (NAPROSYN) 500 MG tablet, Take 1 tablet (500 mg total) by mouth 2 (two) times daily as needed for mild pain, moderate pain or headache (TAKE WITH MEALS.)., Disp: 20 tablet, Rfl: 0   terbinafine (LAMISIL AT) 1 % cream, Apply 1 application topically 2 (two) times daily., Disp: 30 g, Rfl: 1  Social History   Tobacco Use  Smoking Status Every Day   Packs/day: 0.50   Types: Cigarettes  Smokeless Tobacco Never    No Known Allergies Objective:   Vitals:   04/11/22 0939  BP: 118/68   There is no height or weight on file to calculate BMI. Constitutional Well developed. Well nourished.  Vascular Dorsalis  pedis pulses palpable bilaterally. Posterior tibial pulses palpable bilaterally. Capillary refill normal to all digits.  No cyanosis or clubbing noted. Pedal hair growth normal.  Neurologic Normal speech. Oriented to person, place, and time. Epicritic sensation to light touch grossly present bilaterally.  Dermatologic Nails thickened elongated dystrophic mycotic toenails x 2 mild pain on palpation Skin athlete's foot with subjective complaint of itching and epidermal lysis noted  Orthopedic: Normal joint ROM without pain or crepitus bilaterally. No visible deformities. No bony tenderness.   Radiographs: None Assessment:   1. Long-term use of high-risk medication   2. Tinea pedis of both feet   3. Nail fungus   4. Onychomycosis due to dermatophyte    Plan:  Patient was evaluated and treated and all questions answered.  Bilateral hallux onychomycosis -Educated the patient on the etiology of onychomycosis and various treatment options associated with improving the fungal load.  I explained to the patient that there is 3 treatment options available to treat the onychomycosis including topical, p.o., laser treatment.  Patient elected to undergo p.o. options with Lamisil/terbinafine therapy.  In order for me to start the medication therapy, I explained to the patient the importance of evaluating the liver and obtaining the liver function test.  Once the liver function test comes back normal I will start him on 41-month course of Lamisil therapy.  Patient understood all risk and would like to proceed with Lamisil therapy.  I  have asked the patient to immediately stop the Lamisil therapy if she has any reactions to it and call the office or go to the emergency room right away.  Patient states understanding   Bilateral athlete's foot - The patient etiology of athlete's foot emergency room and options were discussed.  Given the amount of athlete's foot that is present no oral medication Lamisil  will help as well as Lotrisone cream Lotrisone cream was sent to the pharmacy.  I advised him apply twice a day.  No follow-ups on file.

## 2022-04-12 LAB — HEPATIC FUNCTION PANEL
ALT: 14 IU/L (ref 0–44)
AST: 22 IU/L (ref 0–40)
Albumin: 5 g/dL (ref 4.3–5.2)
Alkaline Phosphatase: 73 IU/L (ref 44–121)
Bilirubin Total: 0.7 mg/dL (ref 0.0–1.2)
Bilirubin, Direct: 0.16 mg/dL (ref 0.00–0.40)
Total Protein: 7.7 g/dL (ref 6.0–8.5)

## 2022-04-14 MED ORDER — TERBINAFINE HCL 250 MG PO TABS: 250.0000 mg | ORAL_TABLET | Freq: Every day | ORAL | 0 refills | Status: AC

## 2022-04-14 NOTE — Addendum Note (Signed)
Addended by: Boneta Lucks on: 04/14/2022 07:34 AM   Modules accepted: Orders

## 2022-08-13 ENCOUNTER — Ambulatory Visit (INDEPENDENT_AMBULATORY_CARE_PROVIDER_SITE_OTHER): Payer: Medicaid Other | Admitting: Podiatry

## 2022-08-13 DIAGNOSIS — L603 Nail dystrophy: Secondary | ICD-10-CM

## 2022-08-13 NOTE — Progress Notes (Signed)
  Subjective:  Patient ID: Joseph Strickland, male    DOB: July 03, 1995,  MRN: [id number]  Chief Complaint  Patient presents with   Nail Problem    Nail causing discomfort     27 y.o. male presents with the above complaint.  Patient presents with right hallux nail dystrophy.  Patient states causing him some pain.  He would like to think about removal.  He denies any other acute complaints.  Hurts with ambulation worse with pressure.   Review of Systems: Negative except as noted in the HPI. Denies N/V/F/Ch.  No past medical history on file.  Current Outpatient Medications:    bacitracin ointment, Apply 1 application topically 2 (two) times daily., Disp: 120 g, Rfl: 0   benzonatate (TESSALON) 100 MG capsule, Take 1 capsule (100 mg total) by mouth every 8 (eight) hours., Disp: 21 capsule, Rfl: 0   clotrimazole-betamethasone (LOTRISONE) cream, Apply 1 Application topically daily., Disp: 30 g, Rfl: 0   HYDROcodone-acetaminophen (NORCO) 5-325 MG per tablet, Take 1-2 tablets by mouth every 6 (six) hours as needed for severe pain., Disp: 6 tablet, Rfl: 0   lidocaine (XYLOCAINE) 2 % solution, Use as directed 15 mLs in the mouth or throat every 6 (six) hours as needed for mouth pain., Disp: 300 mL, Rfl: 0   naproxen (NAPROSYN) 500 MG tablet, Take 1 tablet (500 mg total) by mouth 2 (two) times daily as needed for mild pain, moderate pain or headache (TAKE WITH MEALS.)., Disp: 20 tablet, Rfl: 0   terbinafine (LAMISIL AT) 1 % cream, Apply 1 application topically 2 (two) times daily., Disp: 30 g, Rfl: 1   terbinafine (LAMISIL) 250 MG tablet, Take 1 tablet (250 mg total) by mouth daily., Disp: 90 tablet, Rfl: 0  Social History   Tobacco Use  Smoking Status Every Day   Packs/day: .5   Types: Cigarettes  Smokeless Tobacco Never    No Known Allergies Objective:  There were no vitals filed for this visit. There is no height or weight on file to calculate BMI. Constitutional Well developed. Well  nourished.  Vascular Dorsalis pedis pulses palpable bilaterally. Posterior tibial pulses palpable bilaterally. Capillary refill normal to all digits.  No cyanosis or clubbing noted. Pedal hair growth normal.  Neurologic Normal speech. Oriented to person, place, and time. Epicritic sensation to light touch grossly present bilaterally.  Dermatologic Nails right hallux thickened elongated dystrophic mycotic nails x 1. Skin within normal limits  Orthopedic: Normal joint ROM without pain or crepitus bilaterally. No visible deformities. No bony tenderness.   Radiographs: None Assessment:   1. Nail dystrophy    Plan:  Patient was evaluated and treated and all questions answered.  Right hallux nail dystrophy -All chills or concerns were discussed with the patient in extensive detail.  Given the amount of pain that he is having he will benefit from total nail avulsion.  He would like to think about will get back to me when he is ready No follow-ups on file.

## 2022-08-22 ENCOUNTER — Ambulatory Visit (INDEPENDENT_AMBULATORY_CARE_PROVIDER_SITE_OTHER): Payer: BLUE CROSS/BLUE SHIELD | Admitting: Podiatry

## 2022-08-22 DIAGNOSIS — L603 Nail dystrophy: Secondary | ICD-10-CM | POA: Diagnosis not present

## 2022-08-22 NOTE — Progress Notes (Signed)
Subjective:  Patient ID: Joseph Strickland, male    DOB: 21-Nov-1995,  MRN: [id number]  Chief Complaint  Patient presents with   Nail Problem    27 y.o. male presents with the above complaint.  Patient presents with bilateral thickened elongated dystrophic mycotic toenails x 2.  Patient states painful to touch is progressive gotten worse worse with ambulation worse with pressure he would like for me to remove the nail.  He has tried treating it with nail fungus medication which has not helped.  At this time he would like to remove it and not make permanent pain scale 7 out of 10 dull achy in nature   Review of Systems: Negative except as noted in the HPI. Denies N/V/F/Ch.  No past medical history on file.  Current Outpatient Medications:    bacitracin ointment, Apply 1 application topically 2 (two) times daily., Disp: 120 g, Rfl: 0   benzonatate (TESSALON) 100 MG capsule, Take 1 capsule (100 mg total) by mouth every 8 (eight) hours., Disp: 21 capsule, Rfl: 0   clotrimazole-betamethasone (LOTRISONE) cream, Apply 1 Application topically daily., Disp: 30 g, Rfl: 0   HYDROcodone-acetaminophen (NORCO) 5-325 MG per tablet, Take 1-2 tablets by mouth every 6 (six) hours as needed for severe pain., Disp: 6 tablet, Rfl: 0   lidocaine (XYLOCAINE) 2 % solution, Use as directed 15 mLs in the mouth or throat every 6 (six) hours as needed for mouth pain., Disp: 300 mL, Rfl: 0   naproxen (NAPROSYN) 500 MG tablet, Take 1 tablet (500 mg total) by mouth 2 (two) times daily as needed for mild pain, moderate pain or headache (TAKE WITH MEALS.)., Disp: 20 tablet, Rfl: 0   terbinafine (LAMISIL AT) 1 % cream, Apply 1 application topically 2 (two) times daily., Disp: 30 g, Rfl: 1   terbinafine (LAMISIL) 250 MG tablet, Take 1 tablet (250 mg total) by mouth daily., Disp: 90 tablet, Rfl: 0  Social History   Tobacco Use  Smoking Status Every Day   Packs/day: .5   Types: Cigarettes  Smokeless Tobacco Never    No Known  Allergies Objective:  There were no vitals filed for this visit. There is no height or weight on file to calculate BMI. Constitutional Well developed. Well nourished.  Vascular Dorsalis pedis pulses palpable bilaterally. Posterior tibial pulses palpable bilaterally. Capillary refill normal to all digits.  No cyanosis or clubbing noted. Pedal hair growth normal.  Neurologic Normal speech. Oriented to person, place, and time. Epicritic sensation to light touch grossly present bilaterally.  Dermatologic Pain on palpation of the entire/total nail on 1st digit of the bilaterally No other open wounds. No skin lesions.  Orthopedic: Normal joint ROM without pain or crepitus bilaterally. No visible deformities. No bony tenderness.   Radiographs: None Assessment:  No diagnosis found. Plan:  Patient was evaluated and treated and all questions answered.  Nail contusion/dystrophy hallux, bilaterally -Patient elects to proceed with minor surgery to remove entire toenail today. Consent reviewed and signed by patient. -Entire/total nail excised. See procedure note. -Educated on post-procedure care including soaking. Written instructions provided and reviewed. -Patient to follow up in 2 weeks for nail check.  Procedure: Excision of entire/total nail  Location: Bilateral 1st toe digit Anesthesia: Lidocaine 1% plain; 1.5 mL and Marcaine 0.5% plain; 1.5 mL, digital block. Skin Prep: Betadine. Dressing: Silvadene; telfa; dry, sterile, compression dressing. Technique: Following skin prep, the toe was exsanguinated and a tourniquet was secured at the base of the toe. The affected nail border  was freed and excised. The tourniquet was then removed and sterile dressing applied. Disposition: Patient tolerated procedure well. Patient to return in 2 weeks for follow-up.   No follow-ups on file.

## 2023-07-21 ENCOUNTER — Other Ambulatory Visit: Payer: Self-pay

## 2023-07-21 ENCOUNTER — Encounter (HOSPITAL_COMMUNITY): Payer: Self-pay

## 2023-07-21 ENCOUNTER — Emergency Department (HOSPITAL_COMMUNITY)
Admission: EM | Admit: 2023-07-21 | Discharge: 2023-07-22 | Discharge status: Against Medical Advice | Attending: Emergency Medicine | Admitting: Emergency Medicine

## 2023-07-21 DIAGNOSIS — H5711 Ocular pain, right eye: Secondary | ICD-10-CM | POA: Diagnosis present

## 2023-07-21 DIAGNOSIS — Z5321 Procedure and treatment not carried out due to patient leaving prior to being seen by health care provider: Secondary | ICD-10-CM | POA: Diagnosis not present

## 2023-07-21 MED ORDER — TETRACAINE HCL 0.5 % OP SOLN: 2.0000 [drp] | Freq: Once | OPHTHALMIC | Status: DC

## 2023-07-21 MED ORDER — FLUORESCEIN SODIUM 1 MG OP STRP: 1.0000 | ORAL_STRIP | Freq: Once | OPHTHALMIC | Status: DC

## 2023-07-21 NOTE — ED Triage Notes (Signed)
 Pt. Was working in the garage and felt something blow into his rt. eye.  The object came out but he is having redness, tearing and eye feels like there is something still in the eye.  When he blinks he has pain and blurred vision

## 2023-07-21 NOTE — ED Notes (Signed)
Pt called multiple times for vitals, no response.

## 2023-07-21 NOTE — ED Provider Triage Note (Signed)
 Emergency Medicine Provider Triage Evaluation Note  Joseph Strickland , a 28 y.o. male  was evaluated in triage.  Pt complains of right eye pain. States that he was in the garage and felt something blow into his eye. He tried eyedrops with no relief. He is able to see, vision mildly blurry due to excess tearing. Is not a contact lens wearer. Unsure last tetanus  Review of Systems  Positive:  Negative:   Physical Exam  BP 126/80 (BP Location: Right Arm)   Pulse 75   Temp 99 F (37.2 C)   Resp 16   Ht 5\' 8"  (1.727 m)   Wt 83.9 kg   SpO2 98%   BMI 28.13 kg/m  Gen:   Awake, no distress   Resp:  Normal effort  MSK:   Moves extremities without difficulty  Other:  Right conjunctival erythema without obvious signs of foreign body.  EOMs intact without pain.  Vision grossly intact.  Medical Decision Making  Medically screening exam initiated at 4:34 PM.  Appropriate orders placed.  Joseph Strickland was informed that the remainder of the evaluation will be completed by another provider, this initial triage assessment does not replace that evaluation, and the importance of remaining in the ED until their evaluation is complete.     Joseph Dk, PA-C 07/21/23 [phone]
